# Patient Record
Sex: Male | Born: 1995 | Race: Black or African American | Hispanic: No | Marital: Single | Smoking: Never smoker
Health system: Southern US, Community
[De-identification: ages and names within clinical notes are randomized; demographics above are authoritative.]

## PROBLEM LIST (undated history)

## (undated) DIAGNOSIS — I4891 Unspecified atrial fibrillation: Secondary | ICD-10-CM

## (undated) DIAGNOSIS — F29 Unspecified psychosis not due to a substance or known physiological condition: Secondary | ICD-10-CM

## (undated) DIAGNOSIS — F1911 Other psychoactive substance abuse, in remission: Secondary | ICD-10-CM

---

## 2015-10-09 ENCOUNTER — Encounter (HOSPITAL_COMMUNITY): Payer: Self-pay | Admitting: Emergency Medicine

## 2015-10-09 ENCOUNTER — Emergency Department (HOSPITAL_COMMUNITY)
Admission: EM | Admit: 2015-10-09 | Discharge: 2015-10-09 | Disposition: A | Discharge status: Home / Self Care | Payer: BLUE CROSS/BLUE SHIELD | Attending: Emergency Medicine | Admitting: Emergency Medicine

## 2015-10-09 DIAGNOSIS — G249 Dystonia, unspecified: Secondary | ICD-10-CM

## 2015-10-09 DIAGNOSIS — Z79899 Other long term (current) drug therapy: Secondary | ICD-10-CM | POA: Insufficient documentation

## 2015-10-09 DIAGNOSIS — Z791 Long term (current) use of non-steroidal anti-inflammatories (NSAID): Secondary | ICD-10-CM | POA: Diagnosis not present

## 2015-10-09 HISTORY — DX: Unspecified psychosis not due to a substance or known physiological condition: F29

## 2015-10-09 LAB — COMPREHENSIVE METABOLIC PANEL
ALT: 18 U/L (ref 17–63)
AST: 27 U/L (ref 15–41)
Albumin: 4.8 g/dL (ref 3.5–5.0)
Alkaline Phosphatase: 99 U/L (ref 38–126)
Anion gap: 8 (ref 5–15)
BUN: 12 mg/dL (ref 6–20)
CHLORIDE: 107 mmol/L (ref 101–111)
CO2: 26 mmol/L (ref 22–32)
CREATININE: 1.03 mg/dL (ref 0.61–1.24)
Calcium: 9.7 mg/dL (ref 8.9–10.3)
GFR calc Af Amer: >60 mL/min (ref >60)
GFR calc non Af Amer: >60 mL/min (ref >60)
Glucose, Bld: 93 mg/dL (ref 65–99)
Potassium: 4.1 mmol/L (ref 3.5–5.1)
SODIUM: 141 mmol/L (ref 135–145)
Total Bilirubin: 0.9 mg/dL (ref 0.3–1.2)
Total Protein: 7.6 g/dL (ref 6.5–8.1)

## 2015-10-09 LAB — CBC
HCT: 48 % (ref 39.0–52.0)
HEMOGLOBIN: 16.7 g/dL (ref 13.0–17.0)
MCH: 30.7 pg (ref 26.0–34.0)
MCHC: 34.8 g/dL (ref 30.0–36.0)
MCV: 88.2 fL (ref 78.0–100.0)
Platelets: 179 10*3/uL (ref 150–400)
RBC: 5.44 MIL/uL (ref 4.22–5.81)
RDW: 12.7 % (ref 11.5–15.5)
WBC: 4.2 10*3/uL (ref 4.0–10.5)

## 2015-10-09 LAB — RAPID URINE DRUG SCREEN, HOSP PERFORMED
AMPHETAMINES: NOT DETECTED
Barbiturates: NOT DETECTED
Benzodiazepines: NOT DETECTED
Cocaine: NOT DETECTED
OPIATES: NOT DETECTED
Tetrahydrocannabinol: POSITIVE — AB

## 2015-10-09 LAB — ACETAMINOPHEN LEVEL: Acetaminophen (Tylenol), Serum: <10 ug/mL — ABNORMAL LOW (ref 10–30)

## 2015-10-09 LAB — ETHANOL: Alcohol, Ethyl (B): <5 mg/dL (ref <5)

## 2015-10-09 LAB — SALICYLATE LEVEL: Salicylate Lvl: <4 mg/dL (ref 2.8–30.0)

## 2015-10-09 NOTE — Progress Notes (Signed)
Cm offered pt and male visitor a 2 page list of in network internal medicine providers, a 2 page list of psychiatrist and a 3 page list of psychologist in [place] from [place] [employer] site  Informed of web site used for further search as needed

## 2015-10-09 NOTE — Progress Notes (Signed)
ED CM consulted by TTS [facility] and medication Cm informed of pt [facility] name as Nadara ModeHaresh Tharwani Cm goggled Dr to find  Cm left message at this number [facility]( [facility] in [facility] [address] [place], [place] [address]  [phone]) requesting a return call to CM, TTS # or SAPPU NP # or faxed PMH to [phone] Encouraged a call from provider to continue to assist this pt plan of care  CM spoke with Dr Adriana Simasook to attempt to get assist with getting [facility] care everywhere records but EDP states he is unable to assist

## 2015-10-09 NOTE — ED Notes (Signed)
MD at bedside. 
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
## 2015-10-09 NOTE — Discharge Instructions (Signed)
Follow[facility] at [facility], [address], [place], [place] [address] this Friday at 8:30 AM.  Please do not miss this appointment as it was difficult to get.

## 2015-10-09 NOTE — Progress Notes (Addendum)
While providing lists of providers Pt and male at bedside mentioned to Cm and TTS, [place] that they think pt s/s today are medical and asked if EDP could evaluate pt  CM spoke with EDP, Adriana Simasook about pt and male at bedside voiced concerns that they think pt issues are medical and not behavior/mental and want to be evaluated  All obtained ED labs wnl except UDS noted positive for tetrahydrocannabinol only

## 2015-10-09 NOTE — Progress Notes (Signed)
entered in d/ instructions' Please make sure you use the toll free number or web site on back of your insurance card To find a loca pcp and verify any provider recommended to you is in [place] network [place]

## 2015-10-09 NOTE — Progress Notes (Signed)
ED CM received a [facility]wani office States [facility] private office Vicky offered [phone] as contact for [facility] Nursing station for someone (SW or CM or MD) to be reached to assist in the plan of care for this pt CM discussed this with Dr Adriana Simasook and Fabio PierceJosephine, SAPPU NP. Dr Adriana Simasook states he has placed an order for ED unit clerk to call on call person for Dr Sara Chuharwani for assistance Cm left Dr Adriana Simasook with the number offered by Lynden AngVicky from Dr Sara Chuharwani office in case it is needed

## 2015-10-09 NOTE — ED Notes (Signed)
Mother reports pt is doing better as medication wears off.

## 2015-10-09 NOTE — ED Notes (Addendum)
Pt was put on olanzapine over a month ago. Since then has had abnormal body movements that have gotten worse. [facility]. Pt alert and oriented 4x. No si/hi. Denies drug use. Pt also reports leg and back pain.

## 2015-10-09 NOTE — ED Provider Notes (Signed)
CSN: [id number]     Arrival date & time 10/09/15  1045 History   First MD Initiated Contact with Patient 10/09/15 1307     Chief Complaint  Patient presents with  . Medical Clearance  . dyskinesia      (Consider location/radiation/quality/duration/timing/severity/associated sxs/prior Treatment) HPI...Marland Kitchen.Marland Kitchen.Status [facility] for [facility].  He was discharged on Zyprexa 10 mg at bedtime. He has subsequently changed the dose to 5 mg twice a day. He complains of a burning and pinching feeling, particularly in his left neck and left arm and jerking of the legs on an episodic basis. He has had no psychiatric follow-up since that admission. No psychosis presently. No suicidal or  homicidal ideation. Nothing makes symptoms better or worse.  Past Medical History  Diagnosis Date  . Psychosis    History reviewed. No pertinent past surgical history. History reviewed. No pertinent family history. Social History  Substance Use Topics  . Smoking status: Never Smoker   . Smokeless tobacco: None  . Alcohol Use: No    Review of Systems  All other systems reviewed and are negative.     Allergies  Review of patient's allergies indicates no known allergies.  Home Medications   Prior to Admission medications   Medication Sig Start Date End Date Taking? Authorizing Provider  naproxen (NAPROSYN) 500 MG tablet Take 500 mg by mouth daily as needed for mild pain.   Yes Historical Provider, MD  OLANZapine (ZYPREXA) 10 MG tablet Take 5 mg by mouth daily.   Yes Historical Provider, MD   BP 121/78 mmHg  Pulse 64  Temp(Src) 98.3 F (36.8 C) (Oral)  Resp 20  SpO2 99% Physical Exam  Constitutional: He is oriented to person, place, and time. He appears well-developed and well-nourished.  HENT:  Head: Normocephalic and atraumatic.  Eyes: Conjunctivae and EOM are normal. Pupils are equal, round, and reactive to light.  Neck: Normal range of motion. Neck  supple.  Cardiovascular: Normal rate and regular rhythm.   Pulmonary/Chest: Effort normal and breath sounds normal.  Abdominal: Soft. Bowel sounds are normal.  Musculoskeletal: Normal range of motion.  Neurological: He is alert and oriented to person, place, and time.  Skin: Skin is warm and dry.  Psychiatric:  Flat affect  Nursing note and vitals reviewed.   ED Course  Procedures (including critical care time) Labs Review Labs Reviewed  ACETAMINOPHEN LEVEL - Abnormal; Notable for the following:    Acetaminophen (Tylenol), Serum <10 (*)    All other components within normal limits  URINE RAPID DRUG SCREEN, HOSP PERFORMED - Abnormal; Notable for the following:    Tetrahydrocannabinol POSITIVE (*)    All other components within normal limits  COMPREHENSIVE METABOLIC PANEL  ETHANOL  SALICYLATE LEVEL  CBC    Imaging Review No results found. I have personally reviewed and evaluated these images and lab results as part of my medical decision-making.   EKG Interpretation None      MDM   Final diagnoses:  Dyskinesia    Behavioral health consult obtained. No criteria for admission. Discussed with [facility] [place]. They will follow-up with the patient on Friday morning at 8:30 AM    Donnetta HutchingBrian Takaya Hyslop, MD 10/09/15 [phone]

## 2015-10-09 NOTE — ED Notes (Signed)
TTS at bedside. 

## 2015-10-09 NOTE — Progress Notes (Signed)
ED CM noted pt without a pcp listed and [employer] insurance  Cm spoke with pt and male at bedside who states pt recently moved back in the area but was [place] a doctor in [place] but can not recall the name ? [place] ? Karren BurlyDwight or location in [place]  Male has assisted pt to go to [employer] site to find a provider in network but can not recall male provider name and reports it will be a month or more for pt to be seen at male provider office Male states she is also attempting to set pt  Up with [facility]  [facility] ED CM spoke with pt on how to obtain an in network pcp with insurance coverage via the customer service number or web site  Cm reviewed ED level of care for crisis/emergent services and community pcp level of care to manage continuous or chronic medical concerns.  The pt voiced understanding CM encouraged pt and discussed pt's responsibility to verify with pt's insurance carrier that any recommended medical provider offered by any emergency room or a hospital provider is within the carrier's network. The pt voiced understanding

## 2016-01-17 ENCOUNTER — Emergency Department: Payer: No Typology Code available for payment source

## 2016-01-17 ENCOUNTER — Emergency Department
Admission: EM | Admit: 2016-01-17 | Discharge: 2016-01-17 | Disposition: A | Discharge status: Home / Self Care | Payer: No Typology Code available for payment source | Attending: Emergency Medicine | Admitting: Emergency Medicine

## 2016-01-17 DIAGNOSIS — Y999 Unspecified external cause status: Secondary | ICD-10-CM | POA: Insufficient documentation

## 2016-01-17 DIAGNOSIS — R51 Headache: Secondary | ICD-10-CM | POA: Diagnosis not present

## 2016-01-17 DIAGNOSIS — Y9389 Activity, other specified: Secondary | ICD-10-CM | POA: Insufficient documentation

## 2016-01-17 DIAGNOSIS — Z041 Encounter for examination and observation following transport accident: Secondary | ICD-10-CM | POA: Diagnosis present

## 2016-01-17 DIAGNOSIS — Y92410 Unspecified street and highway as the place of occurrence of the external cause: Secondary | ICD-10-CM | POA: Insufficient documentation

## 2016-01-17 DIAGNOSIS — R202 Paresthesia of skin: Secondary | ICD-10-CM | POA: Insufficient documentation

## 2016-01-17 DIAGNOSIS — R519 Headache, unspecified: Secondary | ICD-10-CM
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
## 2016-01-17 NOTE — Discharge Instructions (Signed)

## 2016-01-17 NOTE — ED Notes (Signed)
C-collar applied in triage.
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
## 2016-06-29 ENCOUNTER — Emergency Department
Admission: EM | Admit: 2016-06-29 | Discharge: 2016-06-29 | Disposition: A | Discharge status: Home / Self Care | Payer: BLUE CROSS/BLUE SHIELD | Attending: Student in an Organized Health Care Education/Training Program | Admitting: Student in an Organized Health Care Education/Training Program

## 2016-06-29 ENCOUNTER — Encounter: Payer: Self-pay | Admitting: Emergency Medicine

## 2016-06-29 ENCOUNTER — Emergency Department: Payer: BLUE CROSS/BLUE SHIELD

## 2016-06-29 ENCOUNTER — Emergency Department
Admission: EM | Admit: 2016-06-29 | Discharge: 2016-06-29 | Discharge status: Against Medical Advice | Payer: BLUE CROSS/BLUE SHIELD | Attending: Emergency Medicine | Admitting: Emergency Medicine

## 2016-06-29 ENCOUNTER — Other Ambulatory Visit: Payer: Self-pay

## 2016-06-29 DIAGNOSIS — Z5321 Procedure and treatment not carried out due to patient leaving prior to being seen by health care provider: Secondary | ICD-10-CM | POA: Insufficient documentation

## 2016-06-29 DIAGNOSIS — Z9189 Other specified personal risk factors, not elsewhere classified: Secondary | ICD-10-CM

## 2016-06-29 DIAGNOSIS — Z791 Long term (current) use of non-steroidal anti-inflammatories (NSAID): Secondary | ICD-10-CM | POA: Insufficient documentation

## 2016-06-29 DIAGNOSIS — M25569 Pain in unspecified knee: Secondary | ICD-10-CM | POA: Insufficient documentation

## 2016-06-29 DIAGNOSIS — R079 Chest pain, unspecified: Secondary | ICD-10-CM | POA: Insufficient documentation

## 2016-06-29 HISTORY — DX: Unspecified atrial fibrillation: I48.91

## 2016-06-29 HISTORY — DX: Other psychoactive substance abuse, in remission: F19.11

## 2016-06-29 LAB — BASIC METABOLIC PANEL
ANION GAP: 6 (ref 5–15)
BUN: 10 mg/dL (ref 6–20)
CHLORIDE: 103 mmol/L (ref 101–111)
CO2: 28 mmol/L (ref 22–32)
Calcium: 9.2 mg/dL (ref 8.9–10.3)
Creatinine, Ser: 0.84 mg/dL (ref 0.61–1.24)
GFR calc Af Amer: >60 mL/min (ref >60)
GFR calc non Af Amer: >60 mL/min (ref >60)
GLUCOSE: 93 mg/dL (ref 65–99)
Potassium: 3.4 mmol/L — ABNORMAL LOW (ref 3.5–5.1)
Sodium: 137 mmol/L (ref 135–145)

## 2016-06-29 LAB — CBC
HEMATOCRIT: 43.3 % (ref 40.0–52.0)
HEMOGLOBIN: 14.7 g/dL (ref 13.0–18.0)
MCH: 30.7 pg (ref 26.0–34.0)
MCHC: 33.8 g/dL (ref 32.0–36.0)
MCV: 90.8 fL (ref 80.0–100.0)
Platelets: 162 10*3/uL (ref 150–440)
RBC: 4.77 MIL/uL (ref 4.40–5.90)
RDW: 13.4 % (ref 11.5–14.5)
WBC: 8.4 10*3/uL (ref 3.8–10.6)

## 2016-06-29 LAB — TROPONIN I: Troponin I: <0.03 ng/mL (ref <0.03)

## 2016-06-29 NOTE — ED Triage Notes (Signed)
[place] [employer] and felt like he went into afib. C/o chest pain and SHOB with it. No respiratory distress currently. Arrived via EMS. Alert and oriented, skin warm and dry. Has not been taking his afib meds. Reports it is from drug use but he no longer uses.
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
## 2018-03-20 IMAGING — CR DG CHEST 2V
3 series · 3 of 3 positions shown · non-contrast
Comparison: None available

CLINICAL DATA: Chest pain shortness of breath, history of atrial
fibrillation

EXAM:
CHEST  2 VIEW

[chest pa]
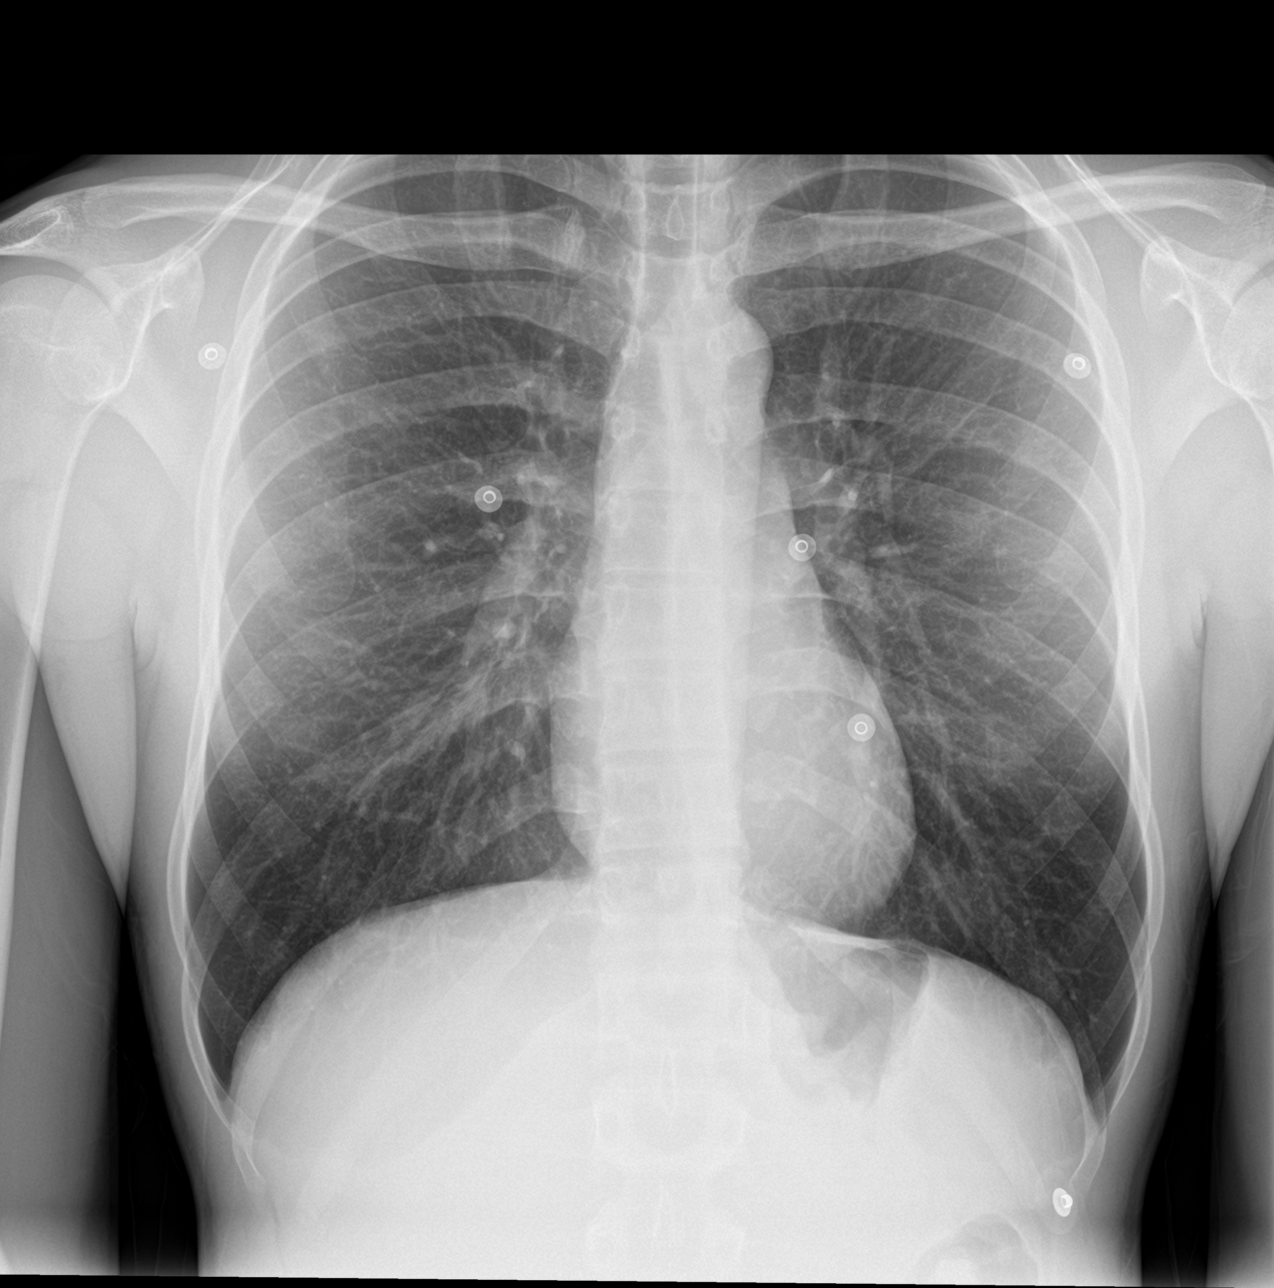

[chest lat (1 of 2)]
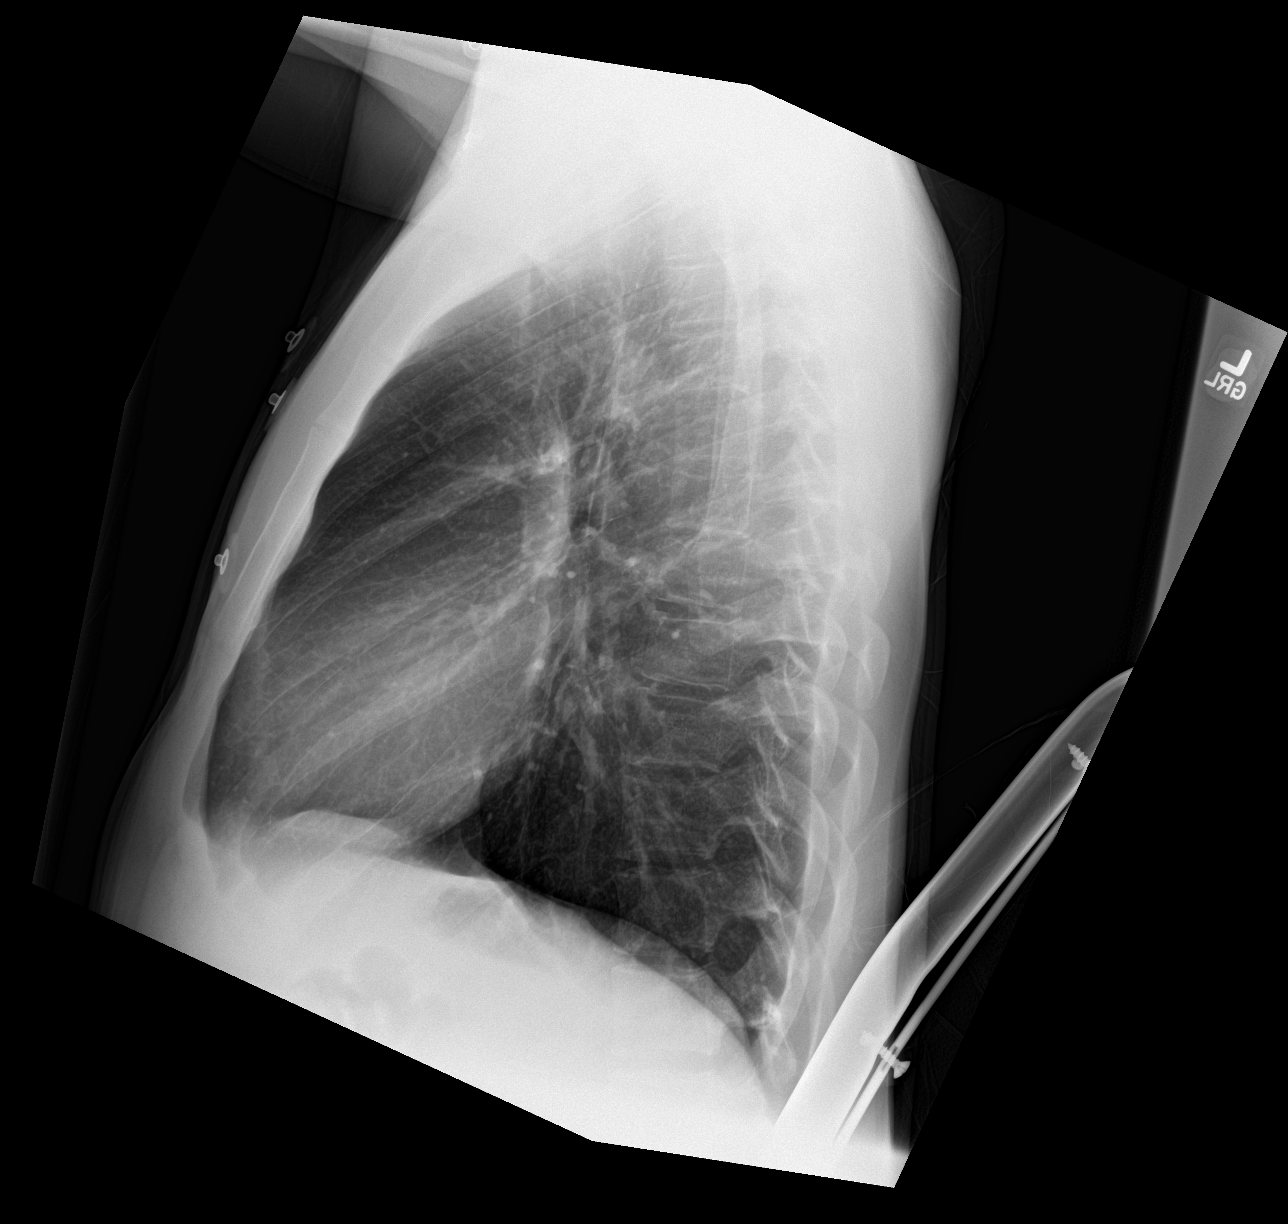

[chest lat (2 of 2)]
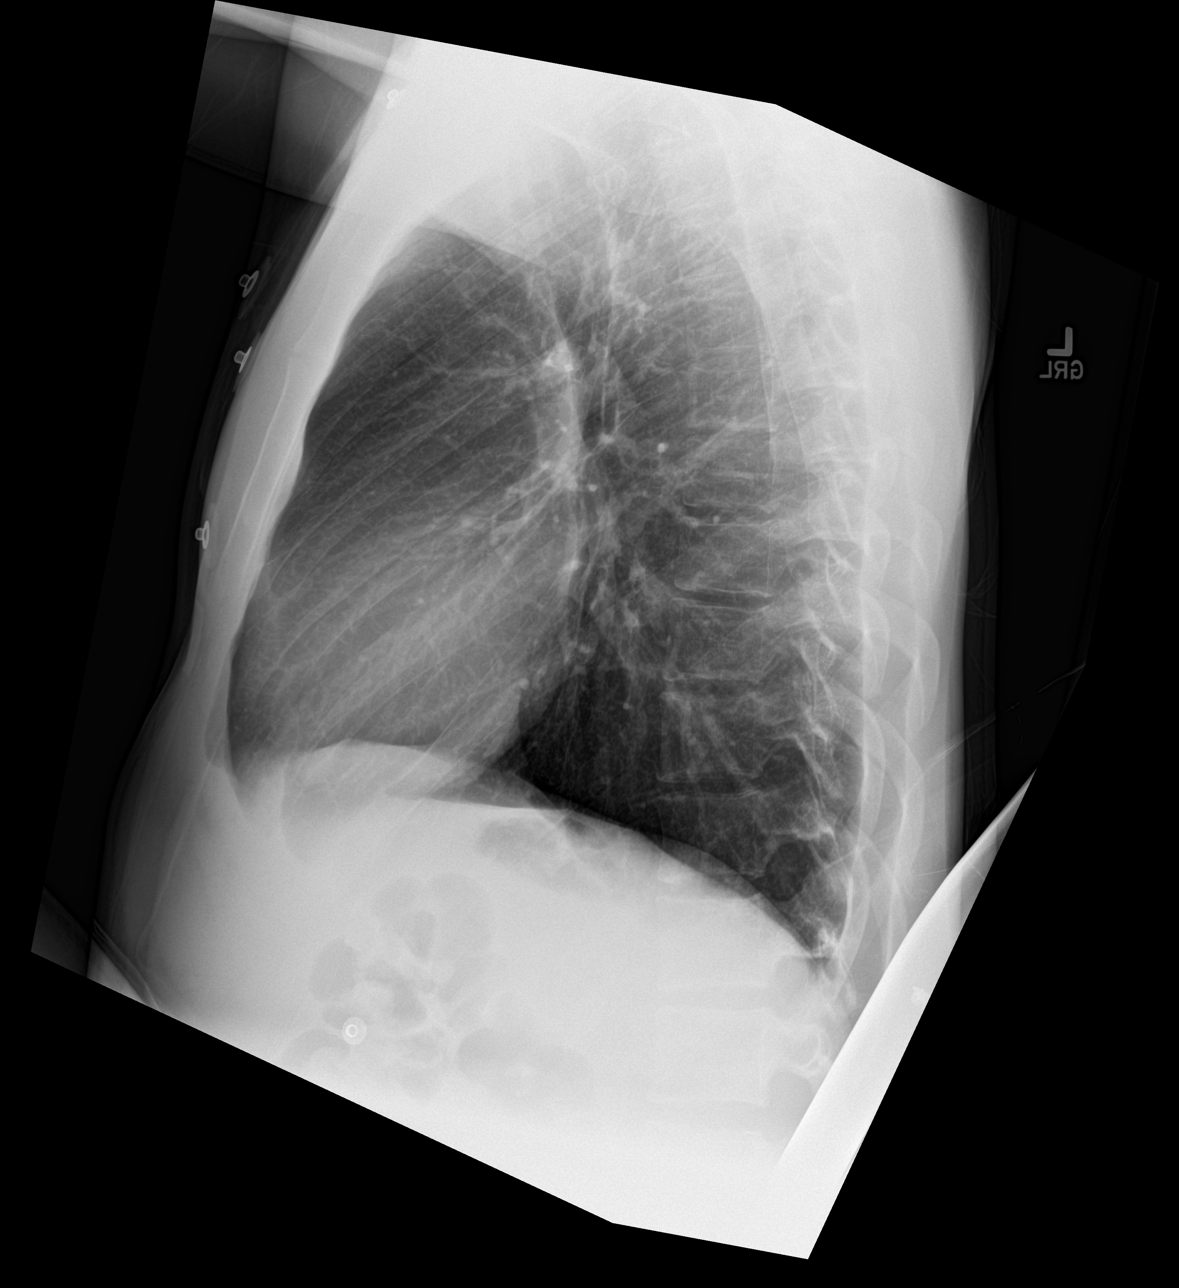

[3 of 3 positions shown; findings below may reference images not displayed]

FINDINGS: The heart size and mediastinal contours are within normal limits.
Both lungs are clear. The visualized skeletal structures are
unremarkable.
IMPRESSION: No active cardiopulmonary disease.
